# Patient Record
Sex: Male | Born: 1980 | Race: White | Hispanic: No | Marital: Single
Health system: Southern US, Community
[De-identification: ages and names within clinical notes are randomized; demographics above are authoritative.]

---

## 2014-08-26 ENCOUNTER — Other Ambulatory Visit: Payer: Self-pay | Admitting: Physician Assistant

## 2014-08-26 DIAGNOSIS — I861 Scrotal varices: Secondary | ICD-10-CM

## 2014-08-30 ENCOUNTER — Ambulatory Visit
Admission: RE | Admit: 2014-08-30 | Discharge: 2014-08-30 | Disposition: A | Discharge status: Home / Self Care | Payer: 59 | Source: Ambulatory Visit | Attending: Physician Assistant | Admitting: Physician Assistant

## 2014-08-30 DIAGNOSIS — I861 Scrotal varices: Secondary | ICD-10-CM

## 2015-08-29 ENCOUNTER — Other Ambulatory Visit: Payer: Self-pay | Admitting: Physician Assistant

## 2015-08-29 DIAGNOSIS — N5089 Other specified disorders of the male genital organs: Secondary | ICD-10-CM

## 2015-09-01 ENCOUNTER — Ambulatory Visit
Admission: RE | Admit: 2015-09-01 | Discharge: 2015-09-01 | Disposition: A | Discharge status: Home / Self Care | Payer: 59 | Source: Ambulatory Visit | Attending: Physician Assistant | Admitting: Physician Assistant

## 2015-09-01 DIAGNOSIS — N5089 Other specified disorders of the male genital organs: Secondary | ICD-10-CM

## 2015-10-04 IMAGING — US US SCROTUM
1 series · 14 of 25 positions shown · non-contrast
Comparison: None.

CLINICAL DATA: Right varicocele.  History of microlithiasis.

EXAM:
SCROTAL ULTRASOUND
DOPPLER ULTRASOUND OF THE TESTICLES
TECHNIQUE: Complete ultrasound examination of the testicles, epididymis, and
other scrotal structures was performed. Color and spectral Doppler
ultrasound were also utilized to evaluate blood flow to the
testicles.

[Series 1: us scrotum · 0.08mm/px · 14 of 56 slices shown]
[im 1/56]
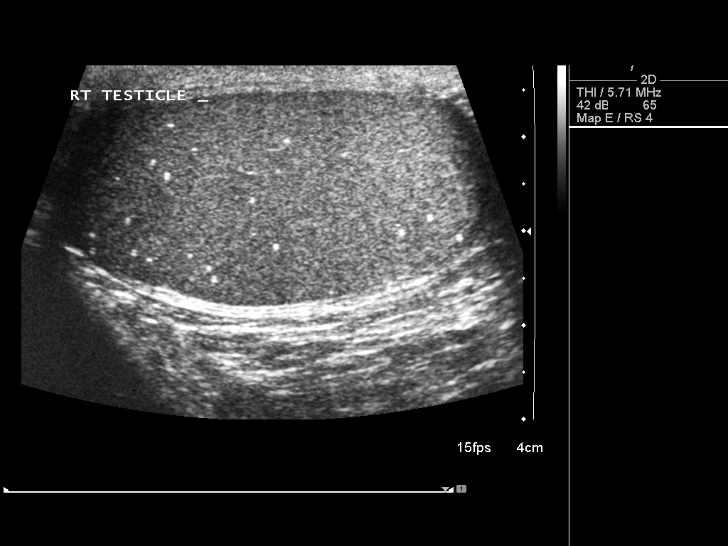
[im 5/56]
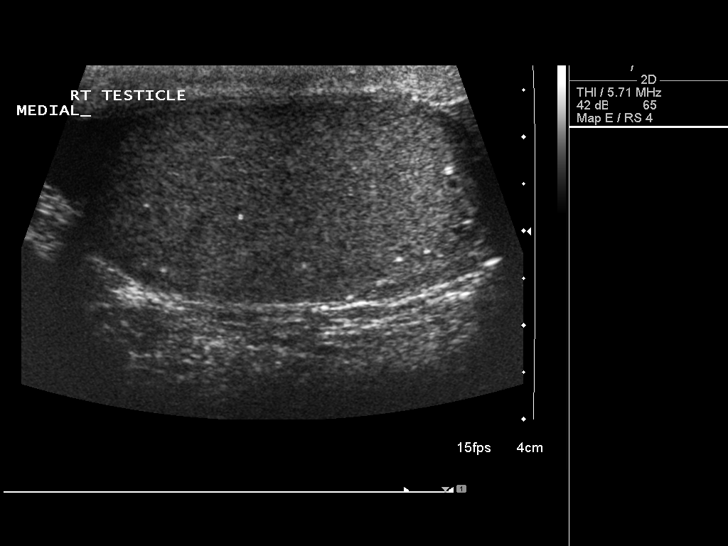
[im 10/56]
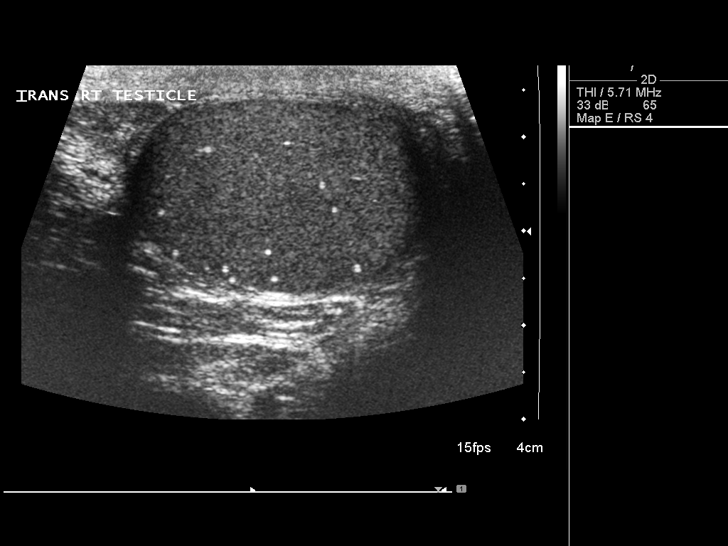
[im 14/56]
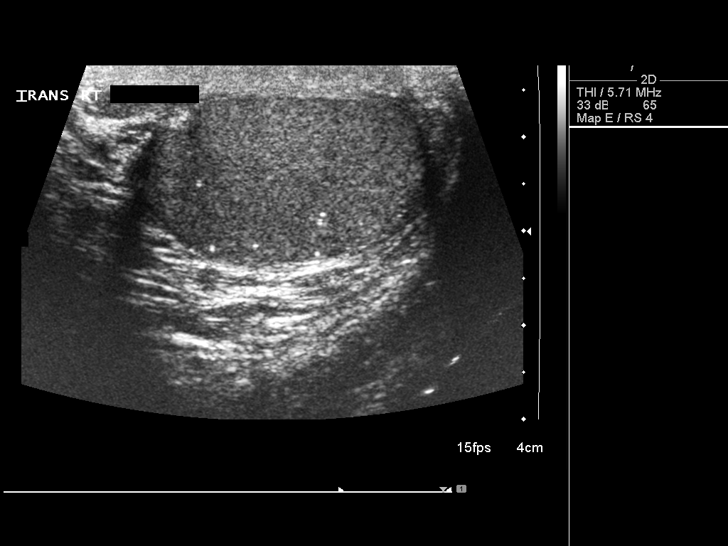
[im 19/56]
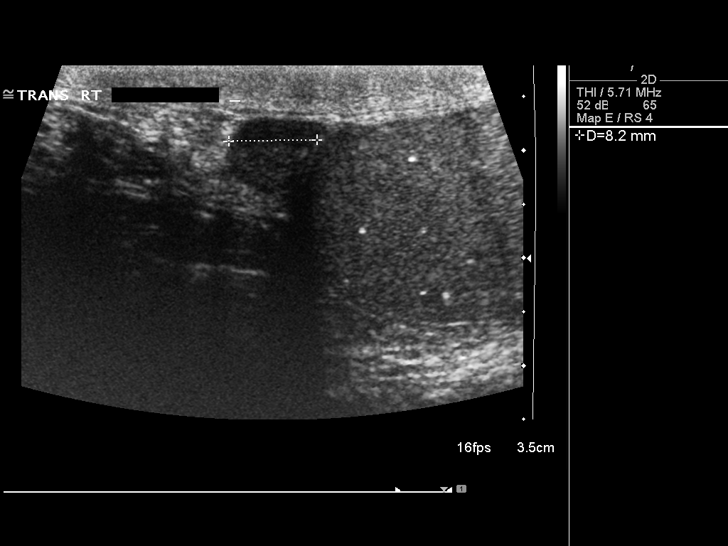
[im 21/56]
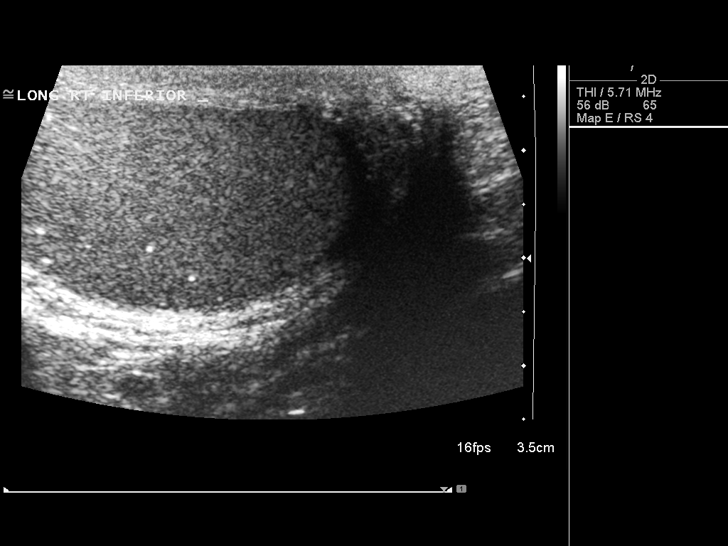
[im 26/56]
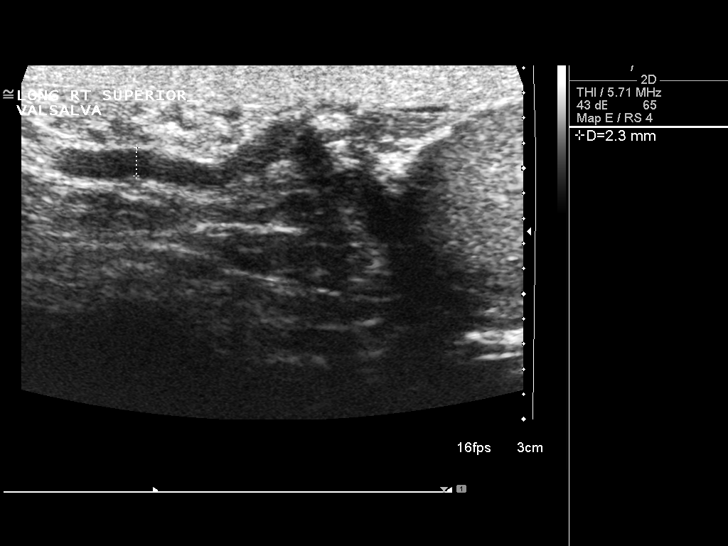
[im 30/56]
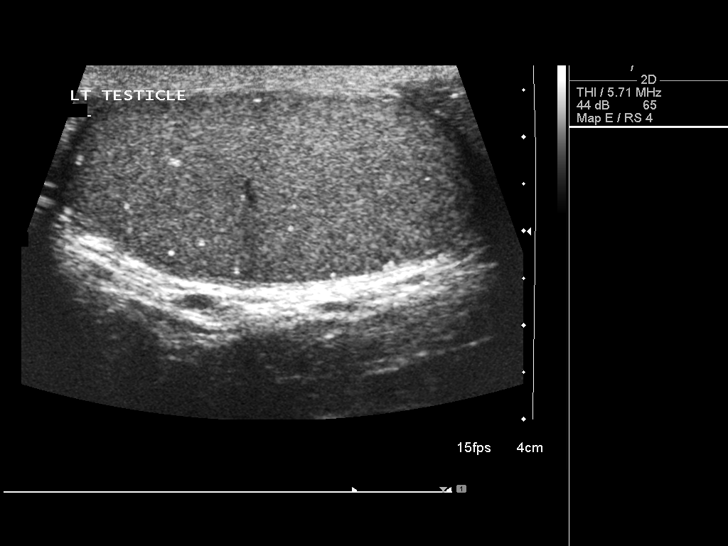
[im 35/56]
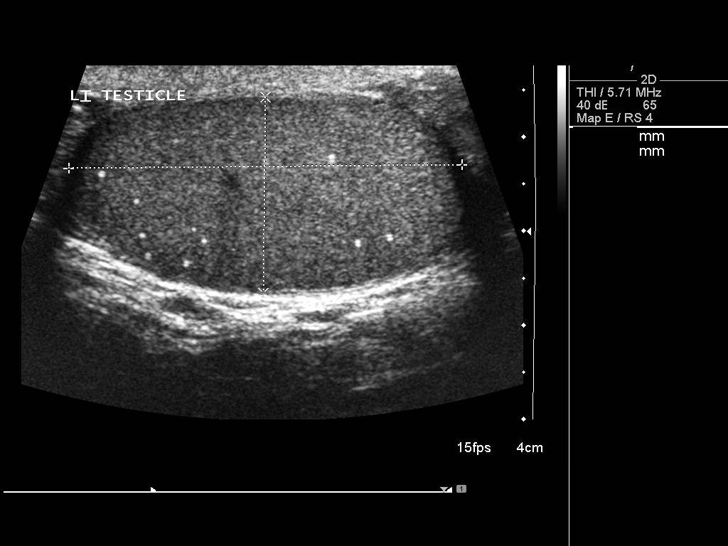
[im 37/56]
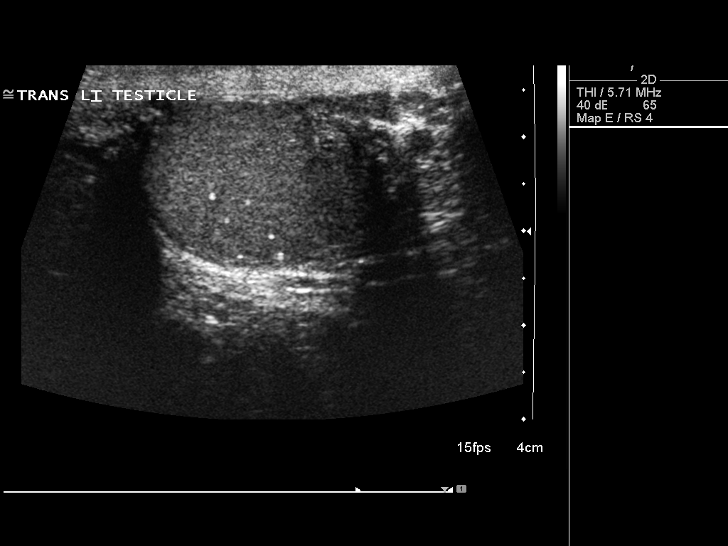
[im 42/56]
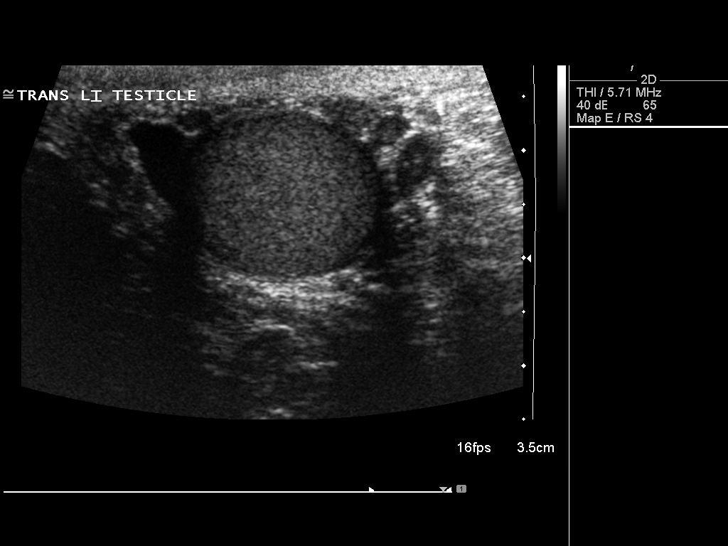
[im 46/56]
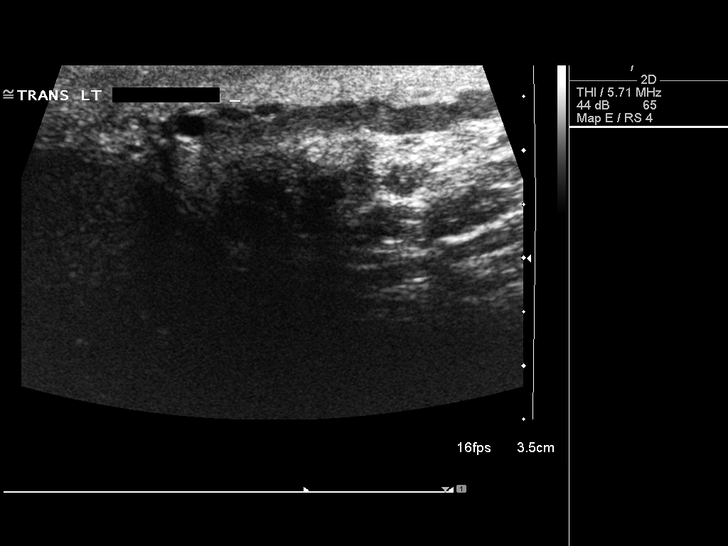
[im 51/56]
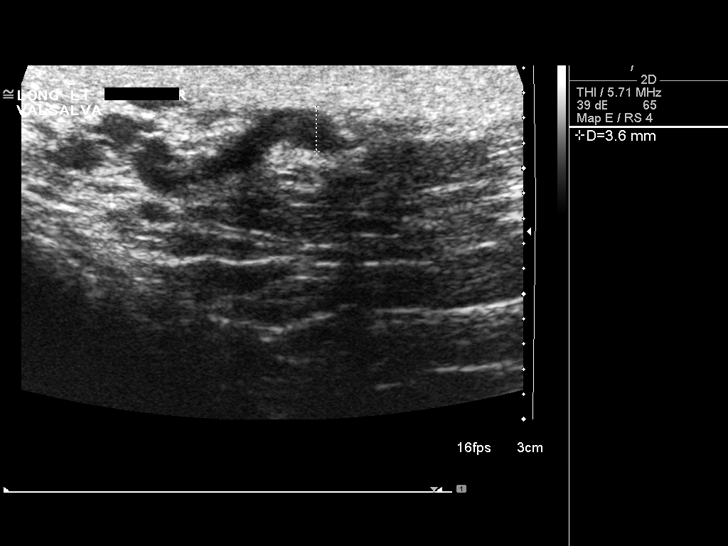
[im 56/56]
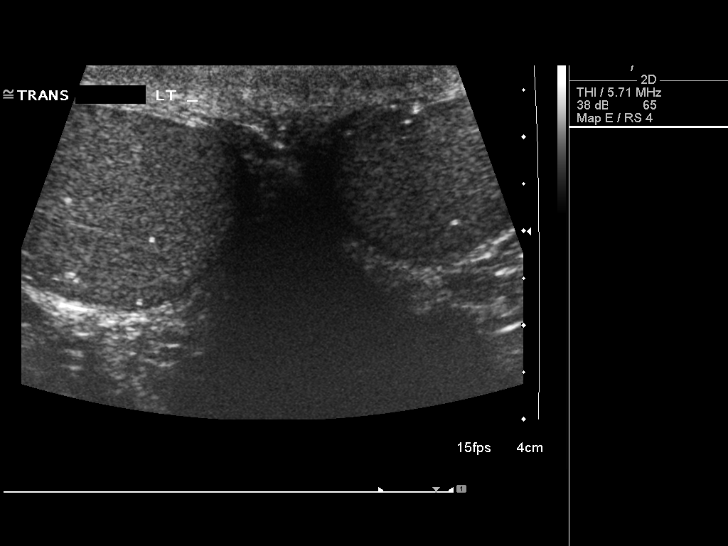

[14 of 25 positions shown; findings below may reference images not displayed]

FINDINGS: Right testicle

Measurements: 4.4 x 2.3 x 2.9 cm. No mass. Microlithiasis
visualized.

Left testicle

Measurements: 4.2 x 2.1 x 2.9 cm. No mass. Microlithiasis
visualized.

Right epididymis:  Normal in size and appearance.

Left epididymis: Normal in size and appearance. 3 mm simple
epididymal cyst.

Hydrocele:  None visualized.

Varicocele:  Small bilateral varicoceles.

Pulsed Doppler interrogation of both testes demonstrates normal low
resistance arterial and venous waveforms bilaterally.
IMPRESSION: 1. Small bilateral varicoceles.
2. Bilateral microlithiasis. No testicular mass. No evidence of
torsion.

## 2016-10-05 IMAGING — US US SCROTUM
1 series · 13 of 25 positions shown · non-contrast
Comparison: None.

CLINICAL DATA: One year interval followup of microlithiasis and
bilateral varicoceles.

EXAM:
ULTRASOUND OF SCROTUM
TECHNIQUE: Complete ultrasound examination of the testicles, epididymis, and
other scrotal structures was performed.

[Series 1: us scrotum · 0.08mm/px · 13 of 42 slices shown]
[im 1/42]
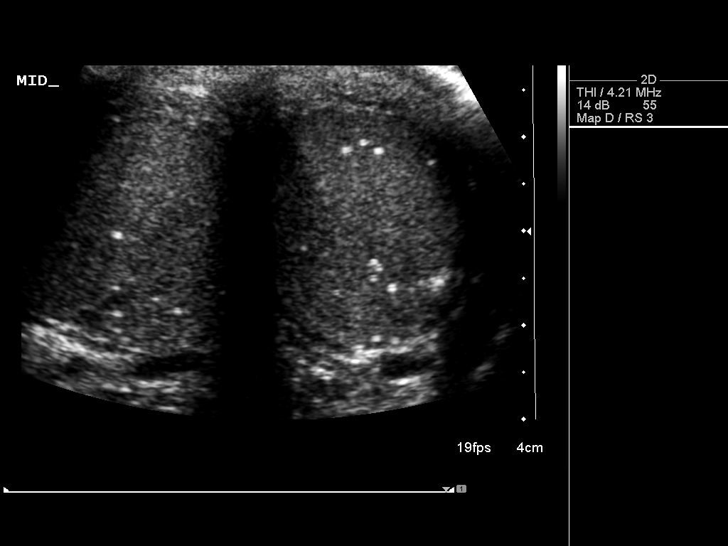
[im 4/42]
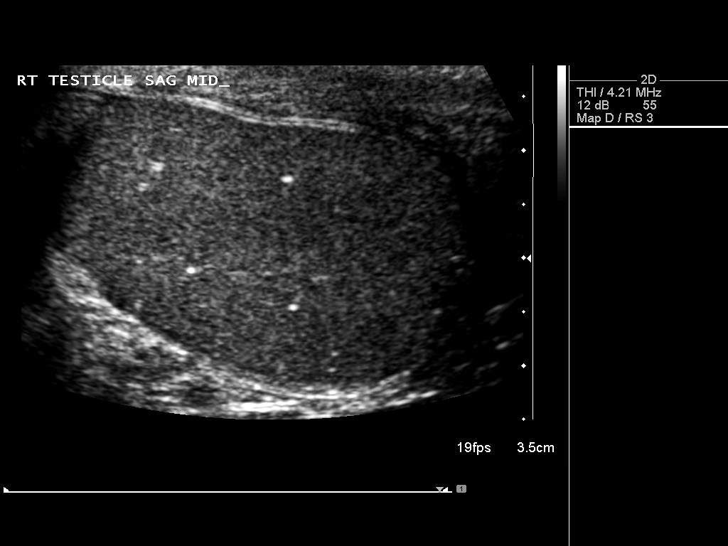
[im 7/42]
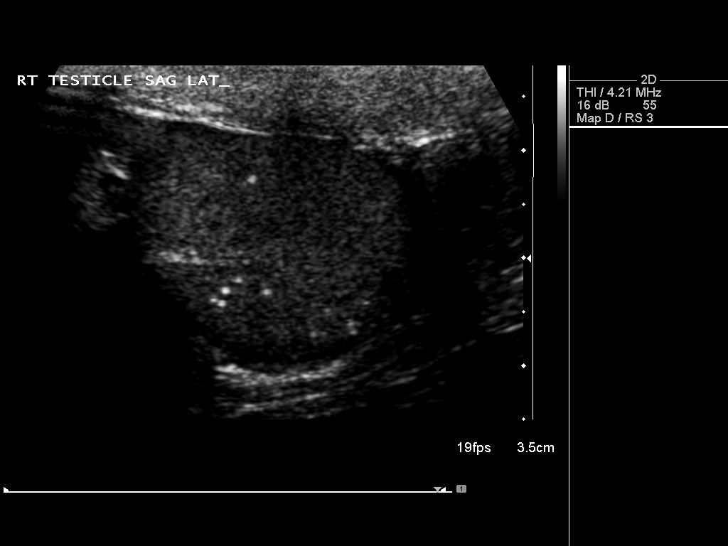
[im 11/42]
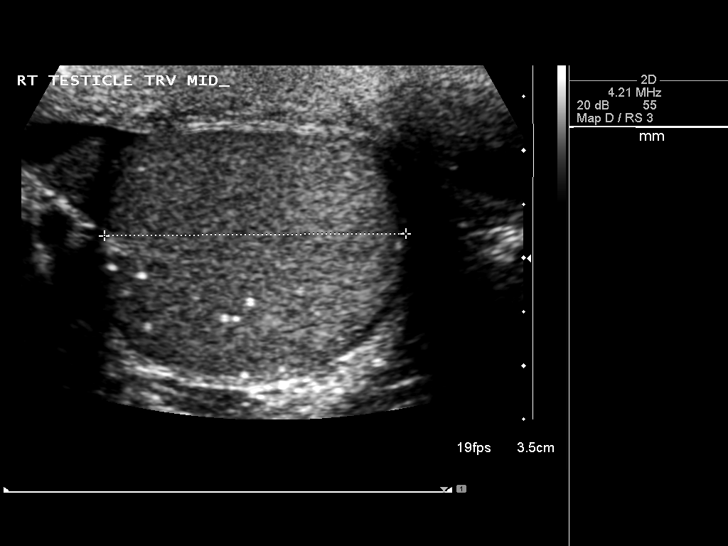
[im 14/42]
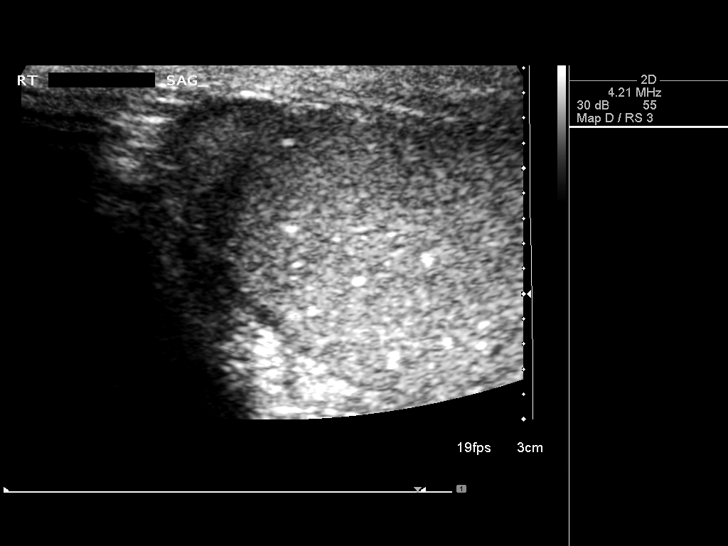
[im 18/42]
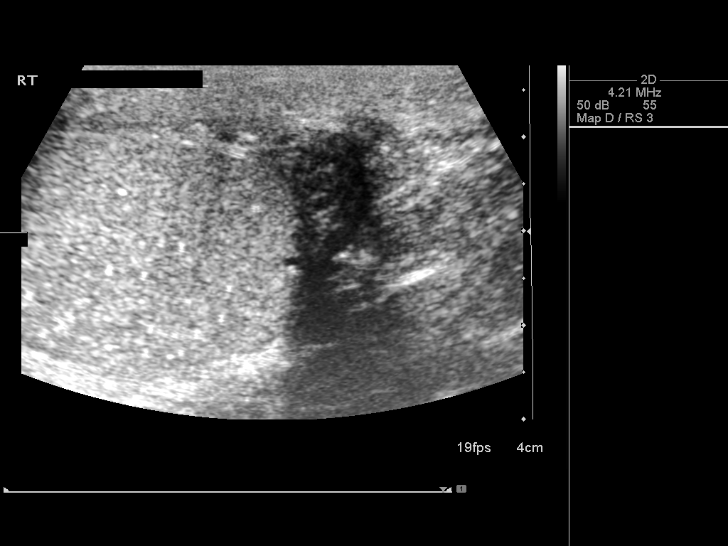
[im 21/42]
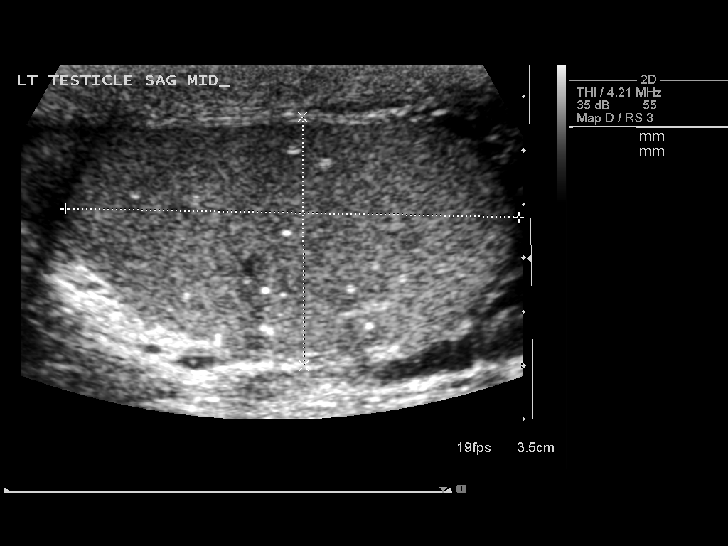
[im 24/42]
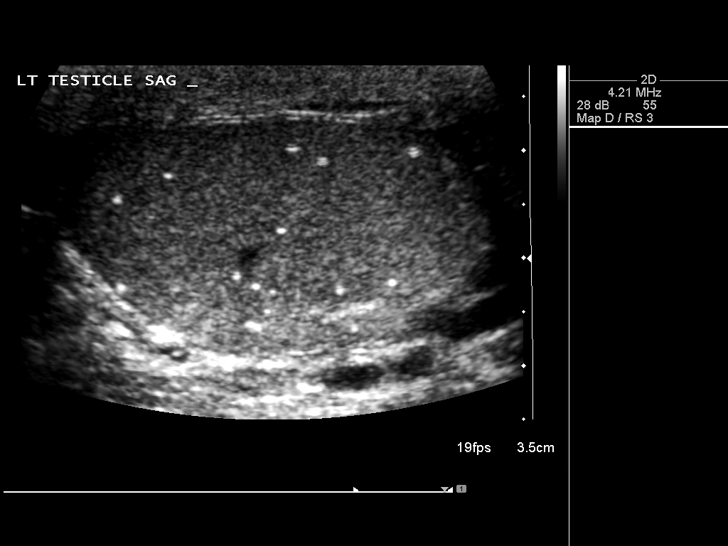
[im 28/42]
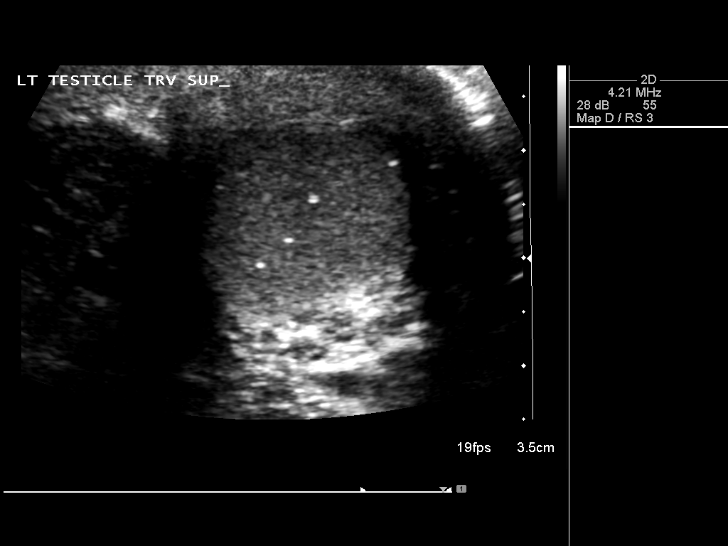
[im 31/42]
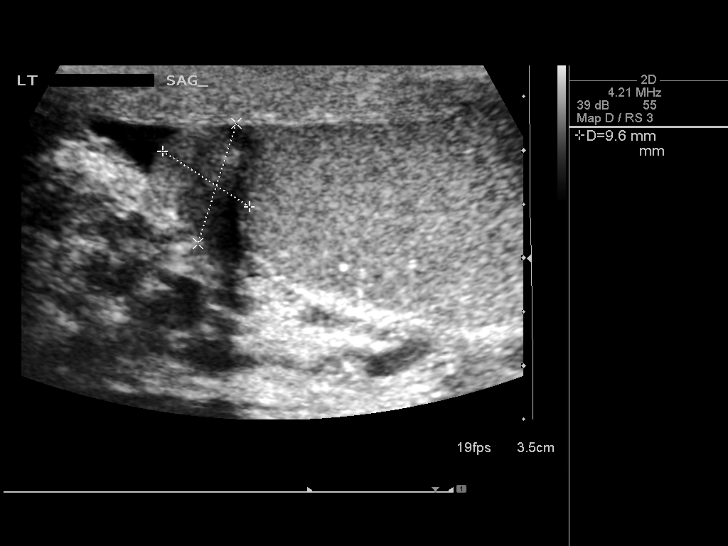
[im 35/42]
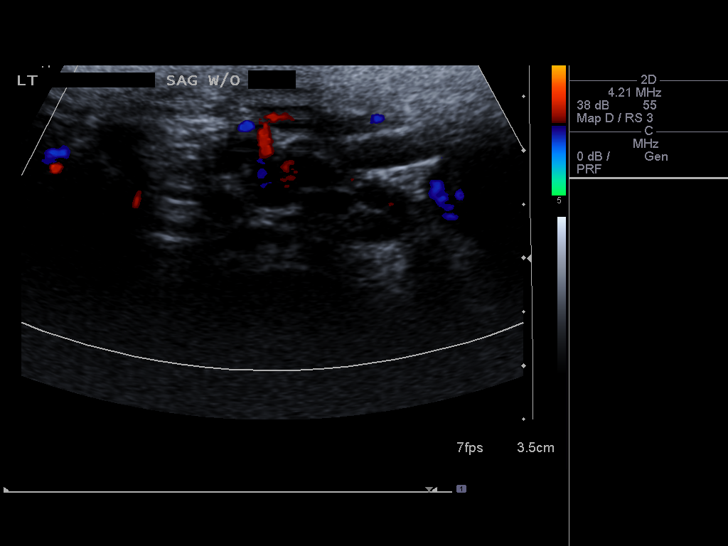
[im 38/42]
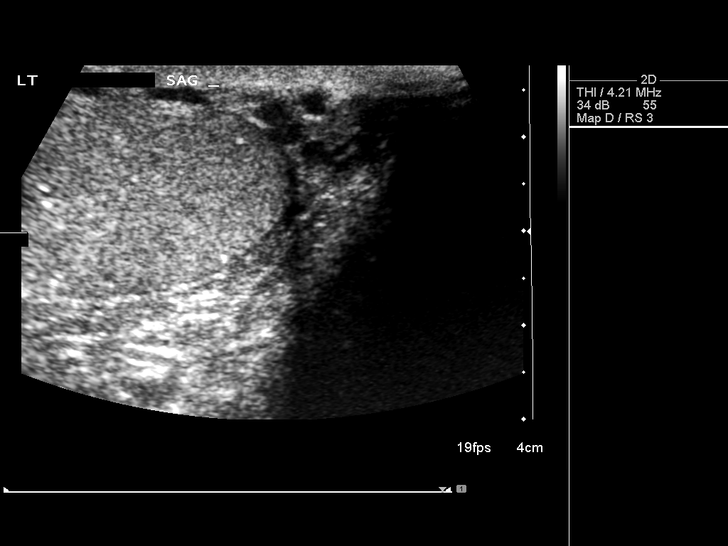
[im 42/42]
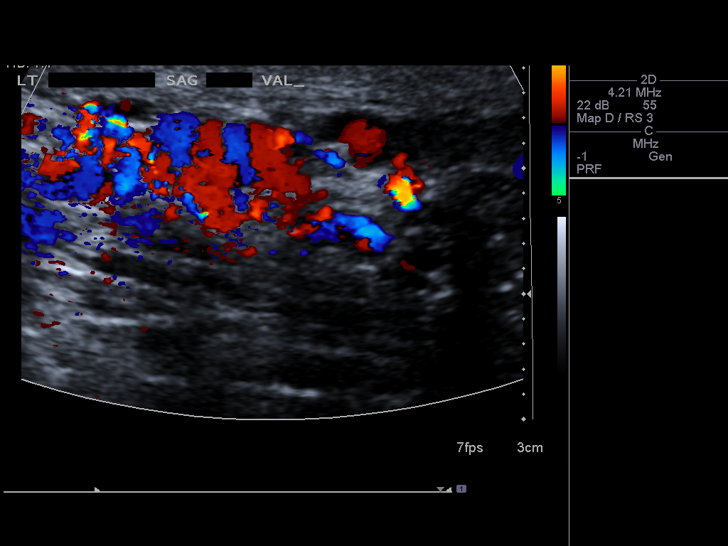

[13 of 25 positions shown; findings below may reference images not displayed]

FINDINGS: Right testicle

Measurements: Approximately 3.6 x 2.3 x 2.8 cm. Microlithiasis as
noted previously. No parenchymal mass. Normal color Doppler flow.

Left testicle

Measurements: Approximately 4.2 x 2.3 x 2.6 cm. Microlithiasis is
noted previously. No parenchymal mass. Normal color Doppler flow.

Right epididymis: Normal in size and appearance without evidence of
hyperemia.

Left epididymis: Normal in size and appearance without evidence of
hyperemia.

Hydrocele:  None visualized.

Varicocele: Moderate-sized left varicocele as noted previously.
Previously identified right varicocele not evaluated.
IMPRESSION: 1. Bilateral testicular microlithiasis, not significantly changed
since the examination 1 year ago. Please see below for followup
recommendations.
2. Moderate-sized left varicocele. Previously identified right
varicocele was not evaluated on the current examination. The patient
will be asked to return for further imaging (at no cost in order to
complete the examination) and an addendum report can be generated at
that time.
Current literature suggests that testicular microlithiasis is not a
significant independent risk factor for development of testicular
carcinoma, and that followup imaging is not warranted in the absence
of other risk factors. Monthly testicular self-examination and
annual physical exams are considered appropriate surveillance. If
patient has other risk factors for testicular carcinoma, then
referral to Urology should be considered. (Reference: Blain, et
al.: A 5-Year Followup Study of Asymptomatic Men with Testicular
Microlithiasis. [facility]rol 8556; [id number].)

## 2019-05-19 ENCOUNTER — Other Ambulatory Visit: Payer: Self-pay

## 2019-05-19 DIAGNOSIS — Z20822 Contact with and (suspected) exposure to covid-19: Secondary | ICD-10-CM

## 2019-05-21 LAB — NOVEL CORONAVIRUS, NAA: SARS-CoV-2, NAA: NOT DETECTED

## 2019-05-22 ENCOUNTER — Telehealth: Payer: Self-pay

## 2019-05-22 NOTE — Telephone Encounter (Signed)
Patient given negative result and verbalized understanding  

## 2019-09-20 ENCOUNTER — Ambulatory Visit: Payer: Self-pay | Attending: Internal Medicine

## 2019-09-20 DIAGNOSIS — Z23 Encounter for immunization: Secondary | ICD-10-CM

## 2019-09-20 NOTE — Progress Notes (Signed)
   Covid-19 Vaccination Clinic  Name:  Marlyn Rabine    MRN: [id number] DOB: 1980/08/30  09/20/2019  Mr. Berns was observed post Covid-19 immunization for 15 minutes without incident. He was provided with Vaccine Information Sheet and instruction to access the V-Safe system.   Mr. Zulauf was instructed to call 911 with any severe reactions post vaccine: Marland Kitchen Difficulty breathing  . Swelling of face and throat  . A fast heartbeat  . A bad rash all over body  . Dizziness and weakness   Immunizations Administered    Name Date Dose VIS Date Route   Pfizer COVID-19 Vaccine 09/20/2019  9:36 AM 0.3 mL 06/08/2019 Intramuscular   Manufacturer: [employer], [employer]   Lot: [id number]   NDC: [address]-2

## 2019-10-15 ENCOUNTER — Ambulatory Visit: Payer: Self-pay | Attending: Internal Medicine

## 2019-10-15 DIAGNOSIS — Z23 Encounter for immunization: Secondary | ICD-10-CM

## 2019-10-15 NOTE — Progress Notes (Signed)
   Covid-19 Vaccination Clinic  Name:  Reginald Hobbs    MRN: [id number] DOB: 04/13/1981  10/15/2019  Reginald Hobbs was observed post Covid-19 immunization for 15 minutes without incident. He was provided with Vaccine Information Sheet and instruction to access the V-Safe system.   Reginald Hobbs was instructed to call 911 with any severe reactions post vaccine: Marland Kitchen Difficulty breathing  . Swelling of face and throat  . A fast heartbeat  . A bad rash all over body  . Dizziness and weakness   Immunizations Administered    Name Date Dose VIS Date Route   Pfizer COVID-19 Vaccine 10/15/2019  8:31 AM 0.3 mL 08/22/2018 Intramuscular   Manufacturer: [employer], [employer]   Lot: [id number]   NDC: [address]-4

## 2020-07-04 DIAGNOSIS — M4856XD Collapsed vertebra, not elsewhere classified, lumbar region, subsequent encounter for fracture with routine healing: Secondary | ICD-10-CM | POA: Diagnosis not present

## 2020-07-04 DIAGNOSIS — M5136 Other intervertebral disc degeneration, lumbar region: Secondary | ICD-10-CM | POA: Diagnosis not present

## 2020-07-04 DIAGNOSIS — M9903 Segmental and somatic dysfunction of lumbar region: Secondary | ICD-10-CM | POA: Diagnosis not present

## 2020-07-04 DIAGNOSIS — G540 Brachial plexus disorders: Secondary | ICD-10-CM | POA: Diagnosis not present

## 2020-07-21 DIAGNOSIS — M9903 Segmental and somatic dysfunction of lumbar region: Secondary | ICD-10-CM | POA: Diagnosis not present

## 2020-07-21 DIAGNOSIS — G540 Brachial plexus disorders: Secondary | ICD-10-CM | POA: Diagnosis not present

## 2020-07-21 DIAGNOSIS — M5136 Other intervertebral disc degeneration, lumbar region: Secondary | ICD-10-CM | POA: Diagnosis not present

## 2020-07-21 DIAGNOSIS — M4856XD Collapsed vertebra, not elsewhere classified, lumbar region, subsequent encounter for fracture with routine healing: Secondary | ICD-10-CM | POA: Diagnosis not present

## 2020-07-28 DIAGNOSIS — M9903 Segmental and somatic dysfunction of lumbar region: Secondary | ICD-10-CM | POA: Diagnosis not present

## 2020-07-28 DIAGNOSIS — G540 Brachial plexus disorders: Secondary | ICD-10-CM | POA: Diagnosis not present

## 2020-07-28 DIAGNOSIS — M5136 Other intervertebral disc degeneration, lumbar region: Secondary | ICD-10-CM | POA: Diagnosis not present

## 2020-07-28 DIAGNOSIS — M4856XD Collapsed vertebra, not elsewhere classified, lumbar region, subsequent encounter for fracture with routine healing: Secondary | ICD-10-CM | POA: Diagnosis not present

## 2020-07-30 DIAGNOSIS — F900 Attention-deficit hyperactivity disorder, predominantly inattentive type: Secondary | ICD-10-CM | POA: Diagnosis not present

## 2020-08-11 DIAGNOSIS — G540 Brachial plexus disorders: Secondary | ICD-10-CM | POA: Diagnosis not present

## 2020-08-11 DIAGNOSIS — M4856XD Collapsed vertebra, not elsewhere classified, lumbar region, subsequent encounter for fracture with routine healing: Secondary | ICD-10-CM | POA: Diagnosis not present

## 2020-08-11 DIAGNOSIS — M9903 Segmental and somatic dysfunction of lumbar region: Secondary | ICD-10-CM | POA: Diagnosis not present

## 2020-08-11 DIAGNOSIS — M5136 Other intervertebral disc degeneration, lumbar region: Secondary | ICD-10-CM | POA: Diagnosis not present

## 2020-08-25 DIAGNOSIS — M5136 Other intervertebral disc degeneration, lumbar region: Secondary | ICD-10-CM | POA: Diagnosis not present

## 2020-08-25 DIAGNOSIS — M4856XD Collapsed vertebra, not elsewhere classified, lumbar region, subsequent encounter for fracture with routine healing: Secondary | ICD-10-CM | POA: Diagnosis not present

## 2020-08-25 DIAGNOSIS — M9903 Segmental and somatic dysfunction of lumbar region: Secondary | ICD-10-CM | POA: Diagnosis not present

## 2020-08-25 DIAGNOSIS — G540 Brachial plexus disorders: Secondary | ICD-10-CM | POA: Diagnosis not present

## 2020-09-08 DIAGNOSIS — M9903 Segmental and somatic dysfunction of lumbar region: Secondary | ICD-10-CM | POA: Diagnosis not present

## 2020-09-08 DIAGNOSIS — M4856XD Collapsed vertebra, not elsewhere classified, lumbar region, subsequent encounter for fracture with routine healing: Secondary | ICD-10-CM | POA: Diagnosis not present

## 2020-09-08 DIAGNOSIS — M5136 Other intervertebral disc degeneration, lumbar region: Secondary | ICD-10-CM | POA: Diagnosis not present

## 2020-09-08 DIAGNOSIS — G540 Brachial plexus disorders: Secondary | ICD-10-CM | POA: Diagnosis not present

## 2020-09-20 DIAGNOSIS — F900 Attention-deficit hyperactivity disorder, predominantly inattentive type: Secondary | ICD-10-CM | POA: Diagnosis not present

## 2020-09-20 DIAGNOSIS — F4322 Adjustment disorder with anxiety: Secondary | ICD-10-CM | POA: Diagnosis not present

## 2020-09-23 DIAGNOSIS — M4856XD Collapsed vertebra, not elsewhere classified, lumbar region, subsequent encounter for fracture with routine healing: Secondary | ICD-10-CM | POA: Diagnosis not present

## 2020-09-23 DIAGNOSIS — G540 Brachial plexus disorders: Secondary | ICD-10-CM | POA: Diagnosis not present

## 2020-09-23 DIAGNOSIS — M9903 Segmental and somatic dysfunction of lumbar region: Secondary | ICD-10-CM | POA: Diagnosis not present

## 2020-09-23 DIAGNOSIS — M5136 Other intervertebral disc degeneration, lumbar region: Secondary | ICD-10-CM | POA: Diagnosis not present

## 2020-09-25 DIAGNOSIS — I10 Essential (primary) hypertension: Secondary | ICD-10-CM | POA: Diagnosis not present

## 2020-09-25 DIAGNOSIS — E7800 Pure hypercholesterolemia, unspecified: Secondary | ICD-10-CM | POA: Diagnosis not present

## 2020-10-06 DIAGNOSIS — M4856XD Collapsed vertebra, not elsewhere classified, lumbar region, subsequent encounter for fracture with routine healing: Secondary | ICD-10-CM | POA: Diagnosis not present

## 2020-10-06 DIAGNOSIS — G540 Brachial plexus disorders: Secondary | ICD-10-CM | POA: Diagnosis not present

## 2020-10-06 DIAGNOSIS — M9903 Segmental and somatic dysfunction of lumbar region: Secondary | ICD-10-CM | POA: Diagnosis not present

## 2020-10-06 DIAGNOSIS — M5136 Other intervertebral disc degeneration, lumbar region: Secondary | ICD-10-CM | POA: Diagnosis not present

## 2020-10-22 DIAGNOSIS — G540 Brachial plexus disorders: Secondary | ICD-10-CM | POA: Diagnosis not present

## 2020-10-22 DIAGNOSIS — M5136 Other intervertebral disc degeneration, lumbar region: Secondary | ICD-10-CM | POA: Diagnosis not present

## 2020-10-22 DIAGNOSIS — M4856XD Collapsed vertebra, not elsewhere classified, lumbar region, subsequent encounter for fracture with routine healing: Secondary | ICD-10-CM | POA: Diagnosis not present

## 2020-10-22 DIAGNOSIS — M9903 Segmental and somatic dysfunction of lumbar region: Secondary | ICD-10-CM | POA: Diagnosis not present

## 2020-11-03 DIAGNOSIS — M7711 Lateral epicondylitis, right elbow: Secondary | ICD-10-CM | POA: Diagnosis not present

## 2020-11-03 DIAGNOSIS — M9903 Segmental and somatic dysfunction of lumbar region: Secondary | ICD-10-CM | POA: Diagnosis not present

## 2020-11-03 DIAGNOSIS — M5136 Other intervertebral disc degeneration, lumbar region: Secondary | ICD-10-CM | POA: Diagnosis not present

## 2020-11-03 DIAGNOSIS — M4856XD Collapsed vertebra, not elsewhere classified, lumbar region, subsequent encounter for fracture with routine healing: Secondary | ICD-10-CM | POA: Diagnosis not present

## 2020-11-17 DIAGNOSIS — M4856XD Collapsed vertebra, not elsewhere classified, lumbar region, subsequent encounter for fracture with routine healing: Secondary | ICD-10-CM | POA: Diagnosis not present

## 2020-11-17 DIAGNOSIS — M5136 Other intervertebral disc degeneration, lumbar region: Secondary | ICD-10-CM | POA: Diagnosis not present

## 2020-11-17 DIAGNOSIS — M9903 Segmental and somatic dysfunction of lumbar region: Secondary | ICD-10-CM | POA: Diagnosis not present

## 2020-11-17 DIAGNOSIS — M7711 Lateral epicondylitis, right elbow: Secondary | ICD-10-CM | POA: Diagnosis not present

## 2020-12-01 DIAGNOSIS — M4856XD Collapsed vertebra, not elsewhere classified, lumbar region, subsequent encounter for fracture with routine healing: Secondary | ICD-10-CM | POA: Diagnosis not present

## 2020-12-01 DIAGNOSIS — M9903 Segmental and somatic dysfunction of lumbar region: Secondary | ICD-10-CM | POA: Diagnosis not present

## 2020-12-01 DIAGNOSIS — M5136 Other intervertebral disc degeneration, lumbar region: Secondary | ICD-10-CM | POA: Diagnosis not present

## 2020-12-01 DIAGNOSIS — M7711 Lateral epicondylitis, right elbow: Secondary | ICD-10-CM | POA: Diagnosis not present

## 2020-12-16 DIAGNOSIS — M9903 Segmental and somatic dysfunction of lumbar region: Secondary | ICD-10-CM | POA: Diagnosis not present

## 2020-12-16 DIAGNOSIS — M7711 Lateral epicondylitis, right elbow: Secondary | ICD-10-CM | POA: Diagnosis not present

## 2020-12-16 DIAGNOSIS — M4856XD Collapsed vertebra, not elsewhere classified, lumbar region, subsequent encounter for fracture with routine healing: Secondary | ICD-10-CM | POA: Diagnosis not present

## 2020-12-16 DIAGNOSIS — M5136 Other intervertebral disc degeneration, lumbar region: Secondary | ICD-10-CM | POA: Diagnosis not present

## 2020-12-30 DIAGNOSIS — M7711 Lateral epicondylitis, right elbow: Secondary | ICD-10-CM | POA: Diagnosis not present

## 2020-12-30 DIAGNOSIS — M4856XD Collapsed vertebra, not elsewhere classified, lumbar region, subsequent encounter for fracture with routine healing: Secondary | ICD-10-CM | POA: Diagnosis not present

## 2020-12-30 DIAGNOSIS — M9903 Segmental and somatic dysfunction of lumbar region: Secondary | ICD-10-CM | POA: Diagnosis not present

## 2020-12-30 DIAGNOSIS — M5136 Other intervertebral disc degeneration, lumbar region: Secondary | ICD-10-CM | POA: Diagnosis not present

## 2021-01-12 DIAGNOSIS — M7711 Lateral epicondylitis, right elbow: Secondary | ICD-10-CM | POA: Diagnosis not present

## 2021-01-12 DIAGNOSIS — M5136 Other intervertebral disc degeneration, lumbar region: Secondary | ICD-10-CM | POA: Diagnosis not present

## 2021-01-12 DIAGNOSIS — M9903 Segmental and somatic dysfunction of lumbar region: Secondary | ICD-10-CM | POA: Diagnosis not present

## 2021-01-12 DIAGNOSIS — M4856XD Collapsed vertebra, not elsewhere classified, lumbar region, subsequent encounter for fracture with routine healing: Secondary | ICD-10-CM | POA: Diagnosis not present

## 2021-01-21 DIAGNOSIS — F410 Panic disorder [episodic paroxysmal anxiety] without agoraphobia: Secondary | ICD-10-CM | POA: Diagnosis not present

## 2021-01-21 DIAGNOSIS — G4700 Insomnia, unspecified: Secondary | ICD-10-CM | POA: Diagnosis not present

## 2021-01-21 DIAGNOSIS — F900 Attention-deficit hyperactivity disorder, predominantly inattentive type: Secondary | ICD-10-CM | POA: Diagnosis not present

## 2021-01-23 DIAGNOSIS — M5136 Other intervertebral disc degeneration, lumbar region: Secondary | ICD-10-CM | POA: Diagnosis not present

## 2021-01-23 DIAGNOSIS — M9902 Segmental and somatic dysfunction of thoracic region: Secondary | ICD-10-CM | POA: Diagnosis not present

## 2021-01-23 DIAGNOSIS — M9903 Segmental and somatic dysfunction of lumbar region: Secondary | ICD-10-CM | POA: Diagnosis not present

## 2021-01-23 DIAGNOSIS — M9905 Segmental and somatic dysfunction of pelvic region: Secondary | ICD-10-CM | POA: Diagnosis not present

## 2021-02-02 DIAGNOSIS — M5136 Other intervertebral disc degeneration, lumbar region: Secondary | ICD-10-CM | POA: Diagnosis not present

## 2021-02-02 DIAGNOSIS — M9902 Segmental and somatic dysfunction of thoracic region: Secondary | ICD-10-CM | POA: Diagnosis not present

## 2021-02-02 DIAGNOSIS — M9903 Segmental and somatic dysfunction of lumbar region: Secondary | ICD-10-CM | POA: Diagnosis not present

## 2021-02-02 DIAGNOSIS — M9901 Segmental and somatic dysfunction of cervical region: Secondary | ICD-10-CM | POA: Diagnosis not present

## 2021-02-16 DIAGNOSIS — M5136 Other intervertebral disc degeneration, lumbar region: Secondary | ICD-10-CM | POA: Diagnosis not present

## 2021-02-16 DIAGNOSIS — M9902 Segmental and somatic dysfunction of thoracic region: Secondary | ICD-10-CM | POA: Diagnosis not present

## 2021-02-16 DIAGNOSIS — M9903 Segmental and somatic dysfunction of lumbar region: Secondary | ICD-10-CM | POA: Diagnosis not present

## 2021-02-16 DIAGNOSIS — M9901 Segmental and somatic dysfunction of cervical region: Secondary | ICD-10-CM | POA: Diagnosis not present

## 2021-03-09 DIAGNOSIS — M5136 Other intervertebral disc degeneration, lumbar region: Secondary | ICD-10-CM | POA: Diagnosis not present

## 2021-03-09 DIAGNOSIS — M9902 Segmental and somatic dysfunction of thoracic region: Secondary | ICD-10-CM | POA: Diagnosis not present

## 2021-03-09 DIAGNOSIS — M9903 Segmental and somatic dysfunction of lumbar region: Secondary | ICD-10-CM | POA: Diagnosis not present

## 2021-03-09 DIAGNOSIS — M9901 Segmental and somatic dysfunction of cervical region: Secondary | ICD-10-CM | POA: Diagnosis not present

## 2021-03-23 DIAGNOSIS — M9901 Segmental and somatic dysfunction of cervical region: Secondary | ICD-10-CM | POA: Diagnosis not present

## 2021-03-23 DIAGNOSIS — M9903 Segmental and somatic dysfunction of lumbar region: Secondary | ICD-10-CM | POA: Diagnosis not present

## 2021-03-23 DIAGNOSIS — M7918 Myalgia, other site: Secondary | ICD-10-CM | POA: Diagnosis not present

## 2021-03-23 DIAGNOSIS — M9902 Segmental and somatic dysfunction of thoracic region: Secondary | ICD-10-CM | POA: Diagnosis not present

## 2021-03-23 DIAGNOSIS — M5136 Other intervertebral disc degeneration, lumbar region: Secondary | ICD-10-CM | POA: Diagnosis not present

## 2021-04-06 DIAGNOSIS — M5136 Other intervertebral disc degeneration, lumbar region: Secondary | ICD-10-CM | POA: Diagnosis not present

## 2021-04-06 DIAGNOSIS — M7918 Myalgia, other site: Secondary | ICD-10-CM | POA: Diagnosis not present

## 2021-04-06 DIAGNOSIS — M25652 Stiffness of left hip, not elsewhere classified: Secondary | ICD-10-CM | POA: Diagnosis not present

## 2021-04-06 DIAGNOSIS — M9903 Segmental and somatic dysfunction of lumbar region: Secondary | ICD-10-CM | POA: Diagnosis not present

## 2021-04-06 DIAGNOSIS — M9901 Segmental and somatic dysfunction of cervical region: Secondary | ICD-10-CM | POA: Diagnosis not present

## 2021-04-06 DIAGNOSIS — M25651 Stiffness of right hip, not elsewhere classified: Secondary | ICD-10-CM | POA: Diagnosis not present

## 2021-04-06 DIAGNOSIS — M9902 Segmental and somatic dysfunction of thoracic region: Secondary | ICD-10-CM | POA: Diagnosis not present

## 2021-04-27 DIAGNOSIS — M7918 Myalgia, other site: Secondary | ICD-10-CM | POA: Diagnosis not present

## 2021-04-27 DIAGNOSIS — M25652 Stiffness of left hip, not elsewhere classified: Secondary | ICD-10-CM | POA: Diagnosis not present

## 2021-04-27 DIAGNOSIS — M5136 Other intervertebral disc degeneration, lumbar region: Secondary | ICD-10-CM | POA: Diagnosis not present

## 2021-04-27 DIAGNOSIS — M9904 Segmental and somatic dysfunction of sacral region: Secondary | ICD-10-CM | POA: Diagnosis not present

## 2021-04-27 DIAGNOSIS — M9903 Segmental and somatic dysfunction of lumbar region: Secondary | ICD-10-CM | POA: Diagnosis not present

## 2021-04-27 DIAGNOSIS — M9905 Segmental and somatic dysfunction of pelvic region: Secondary | ICD-10-CM | POA: Diagnosis not present

## 2021-04-27 DIAGNOSIS — M9902 Segmental and somatic dysfunction of thoracic region: Secondary | ICD-10-CM | POA: Diagnosis not present

## 2021-04-27 DIAGNOSIS — M9901 Segmental and somatic dysfunction of cervical region: Secondary | ICD-10-CM | POA: Diagnosis not present

## 2021-04-27 DIAGNOSIS — M25651 Stiffness of right hip, not elsewhere classified: Secondary | ICD-10-CM | POA: Diagnosis not present

## 2021-05-14 DIAGNOSIS — M25651 Stiffness of right hip, not elsewhere classified: Secondary | ICD-10-CM | POA: Diagnosis not present

## 2021-05-14 DIAGNOSIS — M9901 Segmental and somatic dysfunction of cervical region: Secondary | ICD-10-CM | POA: Diagnosis not present

## 2021-05-14 DIAGNOSIS — M7918 Myalgia, other site: Secondary | ICD-10-CM | POA: Diagnosis not present

## 2021-05-14 DIAGNOSIS — M9905 Segmental and somatic dysfunction of pelvic region: Secondary | ICD-10-CM | POA: Diagnosis not present

## 2021-05-14 DIAGNOSIS — M9903 Segmental and somatic dysfunction of lumbar region: Secondary | ICD-10-CM | POA: Diagnosis not present

## 2021-05-14 DIAGNOSIS — M25652 Stiffness of left hip, not elsewhere classified: Secondary | ICD-10-CM | POA: Diagnosis not present

## 2021-05-14 DIAGNOSIS — M5136 Other intervertebral disc degeneration, lumbar region: Secondary | ICD-10-CM | POA: Diagnosis not present

## 2021-05-14 DIAGNOSIS — M9904 Segmental and somatic dysfunction of sacral region: Secondary | ICD-10-CM | POA: Diagnosis not present

## 2021-05-14 DIAGNOSIS — M9902 Segmental and somatic dysfunction of thoracic region: Secondary | ICD-10-CM | POA: Diagnosis not present

## 2021-07-01 DIAGNOSIS — M9906 Segmental and somatic dysfunction of lower extremity: Secondary | ICD-10-CM | POA: Diagnosis not present

## 2021-07-01 DIAGNOSIS — M5136 Other intervertebral disc degeneration, lumbar region: Secondary | ICD-10-CM | POA: Diagnosis not present

## 2021-07-01 DIAGNOSIS — M9903 Segmental and somatic dysfunction of lumbar region: Secondary | ICD-10-CM | POA: Diagnosis not present

## 2021-07-01 DIAGNOSIS — M9901 Segmental and somatic dysfunction of cervical region: Secondary | ICD-10-CM | POA: Diagnosis not present

## 2021-07-15 DIAGNOSIS — G4700 Insomnia, unspecified: Secondary | ICD-10-CM | POA: Diagnosis not present

## 2021-07-15 DIAGNOSIS — F900 Attention-deficit hyperactivity disorder, predominantly inattentive type: Secondary | ICD-10-CM | POA: Diagnosis not present

## 2021-07-15 DIAGNOSIS — F4310 Post-traumatic stress disorder, unspecified: Secondary | ICD-10-CM | POA: Diagnosis not present

## 2021-07-20 DIAGNOSIS — M5136 Other intervertebral disc degeneration, lumbar region: Secondary | ICD-10-CM | POA: Diagnosis not present

## 2021-07-20 DIAGNOSIS — M9903 Segmental and somatic dysfunction of lumbar region: Secondary | ICD-10-CM | POA: Diagnosis not present

## 2021-07-20 DIAGNOSIS — M9906 Segmental and somatic dysfunction of lower extremity: Secondary | ICD-10-CM | POA: Diagnosis not present

## 2021-07-20 DIAGNOSIS — M9901 Segmental and somatic dysfunction of cervical region: Secondary | ICD-10-CM | POA: Diagnosis not present

## 2021-08-03 DIAGNOSIS — M5136 Other intervertebral disc degeneration, lumbar region: Secondary | ICD-10-CM | POA: Diagnosis not present

## 2021-08-03 DIAGNOSIS — M9906 Segmental and somatic dysfunction of lower extremity: Secondary | ICD-10-CM | POA: Diagnosis not present

## 2021-08-03 DIAGNOSIS — M9903 Segmental and somatic dysfunction of lumbar region: Secondary | ICD-10-CM | POA: Diagnosis not present

## 2021-08-03 DIAGNOSIS — M9901 Segmental and somatic dysfunction of cervical region: Secondary | ICD-10-CM | POA: Diagnosis not present

## 2021-08-24 DIAGNOSIS — M9901 Segmental and somatic dysfunction of cervical region: Secondary | ICD-10-CM | POA: Diagnosis not present

## 2021-08-24 DIAGNOSIS — M9906 Segmental and somatic dysfunction of lower extremity: Secondary | ICD-10-CM | POA: Diagnosis not present

## 2021-08-24 DIAGNOSIS — M9903 Segmental and somatic dysfunction of lumbar region: Secondary | ICD-10-CM | POA: Diagnosis not present

## 2021-08-24 DIAGNOSIS — M5136 Other intervertebral disc degeneration, lumbar region: Secondary | ICD-10-CM | POA: Diagnosis not present

## 2021-11-16 DIAGNOSIS — M9901 Segmental and somatic dysfunction of cervical region: Secondary | ICD-10-CM | POA: Diagnosis not present

## 2021-11-16 DIAGNOSIS — M5136 Other intervertebral disc degeneration, lumbar region: Secondary | ICD-10-CM | POA: Diagnosis not present

## 2021-11-16 DIAGNOSIS — M9906 Segmental and somatic dysfunction of lower extremity: Secondary | ICD-10-CM | POA: Diagnosis not present

## 2021-11-16 DIAGNOSIS — M9903 Segmental and somatic dysfunction of lumbar region: Secondary | ICD-10-CM | POA: Diagnosis not present

## 2021-12-14 DIAGNOSIS — M9906 Segmental and somatic dysfunction of lower extremity: Secondary | ICD-10-CM | POA: Diagnosis not present

## 2021-12-14 DIAGNOSIS — M9903 Segmental and somatic dysfunction of lumbar region: Secondary | ICD-10-CM | POA: Diagnosis not present

## 2021-12-14 DIAGNOSIS — M5136 Other intervertebral disc degeneration, lumbar region: Secondary | ICD-10-CM | POA: Diagnosis not present

## 2021-12-14 DIAGNOSIS — M9901 Segmental and somatic dysfunction of cervical region: Secondary | ICD-10-CM | POA: Diagnosis not present

## 2022-02-01 DIAGNOSIS — M9906 Segmental and somatic dysfunction of lower extremity: Secondary | ICD-10-CM | POA: Diagnosis not present

## 2022-02-01 DIAGNOSIS — M9901 Segmental and somatic dysfunction of cervical region: Secondary | ICD-10-CM | POA: Diagnosis not present

## 2022-02-01 DIAGNOSIS — M5136 Other intervertebral disc degeneration, lumbar region: Secondary | ICD-10-CM | POA: Diagnosis not present

## 2022-02-01 DIAGNOSIS — M9903 Segmental and somatic dysfunction of lumbar region: Secondary | ICD-10-CM | POA: Diagnosis not present

## 2022-02-10 DIAGNOSIS — E7800 Pure hypercholesterolemia, unspecified: Secondary | ICD-10-CM | POA: Diagnosis not present

## 2022-02-10 DIAGNOSIS — I10 Essential (primary) hypertension: Secondary | ICD-10-CM | POA: Diagnosis not present

## 2022-02-10 DIAGNOSIS — Z0000 Encounter for general adult medical examination without abnormal findings: Secondary | ICD-10-CM | POA: Diagnosis not present

## 2022-02-10 DIAGNOSIS — N529 Male erectile dysfunction, unspecified: Secondary | ICD-10-CM | POA: Diagnosis not present

## 2022-02-22 DIAGNOSIS — M9901 Segmental and somatic dysfunction of cervical region: Secondary | ICD-10-CM | POA: Diagnosis not present

## 2022-02-22 DIAGNOSIS — M9903 Segmental and somatic dysfunction of lumbar region: Secondary | ICD-10-CM | POA: Diagnosis not present

## 2022-02-22 DIAGNOSIS — M5136 Other intervertebral disc degeneration, lumbar region: Secondary | ICD-10-CM | POA: Diagnosis not present

## 2022-02-22 DIAGNOSIS — M9906 Segmental and somatic dysfunction of lower extremity: Secondary | ICD-10-CM | POA: Diagnosis not present

## 2022-03-22 DIAGNOSIS — M9906 Segmental and somatic dysfunction of lower extremity: Secondary | ICD-10-CM | POA: Diagnosis not present

## 2022-03-22 DIAGNOSIS — M5136 Other intervertebral disc degeneration, lumbar region: Secondary | ICD-10-CM | POA: Diagnosis not present

## 2022-03-22 DIAGNOSIS — M9901 Segmental and somatic dysfunction of cervical region: Secondary | ICD-10-CM | POA: Diagnosis not present

## 2022-03-22 DIAGNOSIS — M9903 Segmental and somatic dysfunction of lumbar region: Secondary | ICD-10-CM | POA: Diagnosis not present

## 2022-04-13 DIAGNOSIS — M9906 Segmental and somatic dysfunction of lower extremity: Secondary | ICD-10-CM | POA: Diagnosis not present

## 2022-04-13 DIAGNOSIS — M5136 Other intervertebral disc degeneration, lumbar region: Secondary | ICD-10-CM | POA: Diagnosis not present

## 2022-04-13 DIAGNOSIS — M9903 Segmental and somatic dysfunction of lumbar region: Secondary | ICD-10-CM | POA: Diagnosis not present

## 2022-04-13 DIAGNOSIS — M9901 Segmental and somatic dysfunction of cervical region: Secondary | ICD-10-CM | POA: Diagnosis not present

## 2022-05-26 DIAGNOSIS — M9903 Segmental and somatic dysfunction of lumbar region: Secondary | ICD-10-CM | POA: Diagnosis not present

## 2022-05-26 DIAGNOSIS — M5136 Other intervertebral disc degeneration, lumbar region: Secondary | ICD-10-CM | POA: Diagnosis not present

## 2022-05-26 DIAGNOSIS — M9906 Segmental and somatic dysfunction of lower extremity: Secondary | ICD-10-CM | POA: Diagnosis not present

## 2022-05-26 DIAGNOSIS — M9901 Segmental and somatic dysfunction of cervical region: Secondary | ICD-10-CM | POA: Diagnosis not present

## 2022-10-28 DIAGNOSIS — I10 Essential (primary) hypertension: Secondary | ICD-10-CM | POA: Diagnosis not present

## 2022-11-08 DIAGNOSIS — M9903 Segmental and somatic dysfunction of lumbar region: Secondary | ICD-10-CM | POA: Diagnosis not present

## 2022-11-08 DIAGNOSIS — M9906 Segmental and somatic dysfunction of lower extremity: Secondary | ICD-10-CM | POA: Diagnosis not present

## 2022-11-08 DIAGNOSIS — M5136 Other intervertebral disc degeneration, lumbar region: Secondary | ICD-10-CM | POA: Diagnosis not present

## 2022-11-08 DIAGNOSIS — M9901 Segmental and somatic dysfunction of cervical region: Secondary | ICD-10-CM | POA: Diagnosis not present

## 2022-11-24 DIAGNOSIS — M5136 Other intervertebral disc degeneration, lumbar region: Secondary | ICD-10-CM | POA: Diagnosis not present

## 2022-11-24 DIAGNOSIS — M9901 Segmental and somatic dysfunction of cervical region: Secondary | ICD-10-CM | POA: Diagnosis not present

## 2022-11-24 DIAGNOSIS — M9906 Segmental and somatic dysfunction of lower extremity: Secondary | ICD-10-CM | POA: Diagnosis not present

## 2022-11-24 DIAGNOSIS — M9903 Segmental and somatic dysfunction of lumbar region: Secondary | ICD-10-CM | POA: Diagnosis not present

## 2022-12-13 DIAGNOSIS — M9901 Segmental and somatic dysfunction of cervical region: Secondary | ICD-10-CM | POA: Diagnosis not present

## 2022-12-13 DIAGNOSIS — M9906 Segmental and somatic dysfunction of lower extremity: Secondary | ICD-10-CM | POA: Diagnosis not present

## 2022-12-13 DIAGNOSIS — M5136 Other intervertebral disc degeneration, lumbar region: Secondary | ICD-10-CM | POA: Diagnosis not present

## 2022-12-13 DIAGNOSIS — M9903 Segmental and somatic dysfunction of lumbar region: Secondary | ICD-10-CM | POA: Diagnosis not present

## 2023-01-03 DIAGNOSIS — M5136 Other intervertebral disc degeneration, lumbar region: Secondary | ICD-10-CM | POA: Diagnosis not present

## 2023-01-03 DIAGNOSIS — M9906 Segmental and somatic dysfunction of lower extremity: Secondary | ICD-10-CM | POA: Diagnosis not present

## 2023-01-03 DIAGNOSIS — M9901 Segmental and somatic dysfunction of cervical region: Secondary | ICD-10-CM | POA: Diagnosis not present

## 2023-01-03 DIAGNOSIS — M9903 Segmental and somatic dysfunction of lumbar region: Secondary | ICD-10-CM | POA: Diagnosis not present

## 2023-01-11 DIAGNOSIS — F5101 Primary insomnia: Secondary | ICD-10-CM | POA: Diagnosis not present

## 2023-01-11 DIAGNOSIS — F900 Attention-deficit hyperactivity disorder, predominantly inattentive type: Secondary | ICD-10-CM | POA: Diagnosis not present

## 2023-01-31 DIAGNOSIS — M9906 Segmental and somatic dysfunction of lower extremity: Secondary | ICD-10-CM | POA: Diagnosis not present

## 2023-01-31 DIAGNOSIS — M9901 Segmental and somatic dysfunction of cervical region: Secondary | ICD-10-CM | POA: Diagnosis not present

## 2023-01-31 DIAGNOSIS — M9903 Segmental and somatic dysfunction of lumbar region: Secondary | ICD-10-CM | POA: Diagnosis not present

## 2023-01-31 DIAGNOSIS — M5136 Other intervertebral disc degeneration, lumbar region: Secondary | ICD-10-CM | POA: Diagnosis not present

## 2023-03-07 DIAGNOSIS — M5136 Other intervertebral disc degeneration, lumbar region: Secondary | ICD-10-CM | POA: Diagnosis not present

## 2023-03-07 DIAGNOSIS — M9906 Segmental and somatic dysfunction of lower extremity: Secondary | ICD-10-CM | POA: Diagnosis not present

## 2023-03-07 DIAGNOSIS — M9901 Segmental and somatic dysfunction of cervical region: Secondary | ICD-10-CM | POA: Diagnosis not present

## 2023-03-07 DIAGNOSIS — M9903 Segmental and somatic dysfunction of lumbar region: Secondary | ICD-10-CM | POA: Diagnosis not present

## 2023-03-14 DIAGNOSIS — N529 Male erectile dysfunction, unspecified: Secondary | ICD-10-CM | POA: Diagnosis not present

## 2023-03-14 DIAGNOSIS — Z0000 Encounter for general adult medical examination without abnormal findings: Secondary | ICD-10-CM | POA: Diagnosis not present

## 2023-03-14 DIAGNOSIS — I10 Essential (primary) hypertension: Secondary | ICD-10-CM | POA: Diagnosis not present

## 2023-03-14 DIAGNOSIS — E7800 Pure hypercholesterolemia, unspecified: Secondary | ICD-10-CM | POA: Diagnosis not present

## 2023-03-14 DIAGNOSIS — K219 Gastro-esophageal reflux disease without esophagitis: Secondary | ICD-10-CM | POA: Diagnosis not present

## 2023-04-11 DIAGNOSIS — M9901 Segmental and somatic dysfunction of cervical region: Secondary | ICD-10-CM | POA: Diagnosis not present

## 2023-04-11 DIAGNOSIS — M5136 Other intervertebral disc degeneration, lumbar region with discogenic back pain only: Secondary | ICD-10-CM | POA: Diagnosis not present

## 2023-04-11 DIAGNOSIS — M9906 Segmental and somatic dysfunction of lower extremity: Secondary | ICD-10-CM | POA: Diagnosis not present

## 2023-04-11 DIAGNOSIS — M9903 Segmental and somatic dysfunction of lumbar region: Secondary | ICD-10-CM | POA: Diagnosis not present

## 2023-05-02 DIAGNOSIS — M9906 Segmental and somatic dysfunction of lower extremity: Secondary | ICD-10-CM | POA: Diagnosis not present

## 2023-05-02 DIAGNOSIS — M9903 Segmental and somatic dysfunction of lumbar region: Secondary | ICD-10-CM | POA: Diagnosis not present

## 2023-05-02 DIAGNOSIS — M9901 Segmental and somatic dysfunction of cervical region: Secondary | ICD-10-CM | POA: Diagnosis not present

## 2023-05-02 DIAGNOSIS — M5136 Other intervertebral disc degeneration, lumbar region with discogenic back pain only: Secondary | ICD-10-CM | POA: Diagnosis not present

## 2023-06-06 DIAGNOSIS — M9903 Segmental and somatic dysfunction of lumbar region: Secondary | ICD-10-CM | POA: Diagnosis not present

## 2023-06-06 DIAGNOSIS — M5136 Other intervertebral disc degeneration, lumbar region with discogenic back pain only: Secondary | ICD-10-CM | POA: Diagnosis not present

## 2023-06-06 DIAGNOSIS — M9906 Segmental and somatic dysfunction of lower extremity: Secondary | ICD-10-CM | POA: Diagnosis not present

## 2023-06-06 DIAGNOSIS — M9901 Segmental and somatic dysfunction of cervical region: Secondary | ICD-10-CM | POA: Diagnosis not present

## 2023-07-04 DIAGNOSIS — F900 Attention-deficit hyperactivity disorder, predominantly inattentive type: Secondary | ICD-10-CM | POA: Diagnosis not present

## 2023-07-04 DIAGNOSIS — F5101 Primary insomnia: Secondary | ICD-10-CM | POA: Diagnosis not present

## 2023-07-05 DIAGNOSIS — M9906 Segmental and somatic dysfunction of lower extremity: Secondary | ICD-10-CM | POA: Diagnosis not present

## 2023-07-05 DIAGNOSIS — M5136 Other intervertebral disc degeneration, lumbar region with discogenic back pain only: Secondary | ICD-10-CM | POA: Diagnosis not present

## 2023-07-05 DIAGNOSIS — M9901 Segmental and somatic dysfunction of cervical region: Secondary | ICD-10-CM | POA: Diagnosis not present

## 2023-07-05 DIAGNOSIS — M9903 Segmental and somatic dysfunction of lumbar region: Secondary | ICD-10-CM | POA: Diagnosis not present

## 2023-08-09 DIAGNOSIS — M9906 Segmental and somatic dysfunction of lower extremity: Secondary | ICD-10-CM | POA: Diagnosis not present

## 2023-08-09 DIAGNOSIS — M9903 Segmental and somatic dysfunction of lumbar region: Secondary | ICD-10-CM | POA: Diagnosis not present

## 2023-08-09 DIAGNOSIS — M9901 Segmental and somatic dysfunction of cervical region: Secondary | ICD-10-CM | POA: Diagnosis not present

## 2023-08-09 DIAGNOSIS — M5136 Other intervertebral disc degeneration, lumbar region with discogenic back pain only: Secondary | ICD-10-CM | POA: Diagnosis not present

## 2023-09-06 DIAGNOSIS — M9906 Segmental and somatic dysfunction of lower extremity: Secondary | ICD-10-CM | POA: Diagnosis not present

## 2023-09-06 DIAGNOSIS — M5136 Other intervertebral disc degeneration, lumbar region with discogenic back pain only: Secondary | ICD-10-CM | POA: Diagnosis not present

## 2023-09-06 DIAGNOSIS — M9901 Segmental and somatic dysfunction of cervical region: Secondary | ICD-10-CM | POA: Diagnosis not present

## 2023-09-06 DIAGNOSIS — M9903 Segmental and somatic dysfunction of lumbar region: Secondary | ICD-10-CM | POA: Diagnosis not present

## 2023-09-13 DIAGNOSIS — I10 Essential (primary) hypertension: Secondary | ICD-10-CM | POA: Diagnosis not present

## 2023-09-13 DIAGNOSIS — E7800 Pure hypercholesterolemia, unspecified: Secondary | ICD-10-CM | POA: Diagnosis not present

## 2023-10-04 DIAGNOSIS — M9903 Segmental and somatic dysfunction of lumbar region: Secondary | ICD-10-CM | POA: Diagnosis not present

## 2023-10-04 DIAGNOSIS — M9901 Segmental and somatic dysfunction of cervical region: Secondary | ICD-10-CM | POA: Diagnosis not present

## 2023-10-04 DIAGNOSIS — M5136 Other intervertebral disc degeneration, lumbar region with discogenic back pain only: Secondary | ICD-10-CM | POA: Diagnosis not present

## 2023-10-04 DIAGNOSIS — M9906 Segmental and somatic dysfunction of lower extremity: Secondary | ICD-10-CM | POA: Diagnosis not present

## 2023-11-01 DIAGNOSIS — M5136 Other intervertebral disc degeneration, lumbar region with discogenic back pain only: Secondary | ICD-10-CM | POA: Diagnosis not present

## 2023-11-01 DIAGNOSIS — M9903 Segmental and somatic dysfunction of lumbar region: Secondary | ICD-10-CM | POA: Diagnosis not present

## 2023-11-01 DIAGNOSIS — M9901 Segmental and somatic dysfunction of cervical region: Secondary | ICD-10-CM | POA: Diagnosis not present

## 2023-11-01 DIAGNOSIS — M9906 Segmental and somatic dysfunction of lower extremity: Secondary | ICD-10-CM | POA: Diagnosis not present

## 2023-11-29 DIAGNOSIS — M9903 Segmental and somatic dysfunction of lumbar region: Secondary | ICD-10-CM | POA: Diagnosis not present

## 2023-11-29 DIAGNOSIS — M5136 Other intervertebral disc degeneration, lumbar region with discogenic back pain only: Secondary | ICD-10-CM | POA: Diagnosis not present

## 2023-11-29 DIAGNOSIS — M9901 Segmental and somatic dysfunction of cervical region: Secondary | ICD-10-CM | POA: Diagnosis not present

## 2023-11-29 DIAGNOSIS — M9906 Segmental and somatic dysfunction of lower extremity: Secondary | ICD-10-CM | POA: Diagnosis not present

## 2023-12-23 DIAGNOSIS — F900 Attention-deficit hyperactivity disorder, predominantly inattentive type: Secondary | ICD-10-CM | POA: Diagnosis not present

## 2023-12-23 DIAGNOSIS — F5101 Primary insomnia: Secondary | ICD-10-CM | POA: Diagnosis not present

## 2024-03-20 DIAGNOSIS — K219 Gastro-esophageal reflux disease without esophagitis: Secondary | ICD-10-CM | POA: Diagnosis not present

## 2024-03-20 DIAGNOSIS — Z0000 Encounter for general adult medical examination without abnormal findings: Secondary | ICD-10-CM | POA: Diagnosis not present

## 2024-03-20 DIAGNOSIS — E7800 Pure hypercholesterolemia, unspecified: Secondary | ICD-10-CM | POA: Diagnosis not present

## 2024-03-20 DIAGNOSIS — N529 Male erectile dysfunction, unspecified: Secondary | ICD-10-CM | POA: Diagnosis not present

## 2024-03-20 DIAGNOSIS — Z6823 Body mass index (BMI) 23.0-23.9, adult: Secondary | ICD-10-CM | POA: Diagnosis not present

## 2024-03-20 DIAGNOSIS — I10 Essential (primary) hypertension: Secondary | ICD-10-CM | POA: Diagnosis not present

## 2024-05-01 DIAGNOSIS — M5136 Other intervertebral disc degeneration, lumbar region with discogenic back pain only: Secondary | ICD-10-CM | POA: Diagnosis not present

## 2024-05-01 DIAGNOSIS — M9901 Segmental and somatic dysfunction of cervical region: Secondary | ICD-10-CM | POA: Diagnosis not present

## 2024-05-01 DIAGNOSIS — M9903 Segmental and somatic dysfunction of lumbar region: Secondary | ICD-10-CM | POA: Diagnosis not present

## 2024-05-01 DIAGNOSIS — M9906 Segmental and somatic dysfunction of lower extremity: Secondary | ICD-10-CM | POA: Diagnosis not present

## 2024-05-29 DIAGNOSIS — M9903 Segmental and somatic dysfunction of lumbar region: Secondary | ICD-10-CM | POA: Diagnosis not present

## 2024-05-29 DIAGNOSIS — M9901 Segmental and somatic dysfunction of cervical region: Secondary | ICD-10-CM | POA: Diagnosis not present

## 2024-05-29 DIAGNOSIS — M9906 Segmental and somatic dysfunction of lower extremity: Secondary | ICD-10-CM | POA: Diagnosis not present

## 2024-05-29 DIAGNOSIS — M5136 Other intervertebral disc degeneration, lumbar region with discogenic back pain only: Secondary | ICD-10-CM | POA: Diagnosis not present

## 2024-06-20 DIAGNOSIS — R051 Acute cough: Secondary | ICD-10-CM | POA: Diagnosis not present

## 2024-06-20 DIAGNOSIS — J101 Influenza due to other identified influenza virus with other respiratory manifestations: Secondary | ICD-10-CM | POA: Diagnosis not present
